# Patient Record
Sex: Male | Born: 1980 | Race: White | Hispanic: No | Marital: Married | Smoking: Never smoker
Health system: Southern US, Community
[De-identification: ages and names within clinical notes are randomized; demographics above are authoritative.]

## PROBLEM LIST (undated history)

## (undated) DIAGNOSIS — G43909 Migraine, unspecified, not intractable, without status migrainosus: Secondary | ICD-10-CM

## (undated) DIAGNOSIS — F439 Reaction to severe stress, unspecified: Secondary | ICD-10-CM

## (undated) HISTORY — DX: Migraine, unspecified, not intractable, without status migrainosus: G43.909

## (undated) HISTORY — PX: GALLBLADDER SURGERY: SHX652

## (undated) HISTORY — PX: APPENDECTOMY: SHX54

---

## 2014-08-06 ENCOUNTER — Ambulatory Visit: Payer: Self-pay | Admitting: Family Medicine

## 2014-08-12 ENCOUNTER — Encounter: Payer: Self-pay | Admitting: Family Medicine

## 2014-08-12 ENCOUNTER — Ambulatory Visit (INDEPENDENT_AMBULATORY_CARE_PROVIDER_SITE_OTHER): Payer: BLUE CROSS/BLUE SHIELD | Admitting: Family Medicine

## 2014-08-12 VITALS — BP 143/95 | HR 82 | Ht 71.0 in | Wt 200.0 lb

## 2014-08-12 DIAGNOSIS — G43809 Other migraine, not intractable, without status migrainosus: Secondary | ICD-10-CM

## 2014-08-12 DIAGNOSIS — H6123 Impacted cerumen, bilateral: Secondary | ICD-10-CM | POA: Diagnosis not present

## 2014-08-12 MED ORDER — RELPAX 40 MG PO TABS: 40.0000 mg | ORAL_TABLET | Freq: Every day | ORAL | Status: AC | PRN

## 2014-08-12 MED ORDER — TOPIRAMATE 25 MG PO TABS: 75.0000 mg | ORAL_TABLET | Freq: Every day | ORAL | Status: DC

## 2014-08-12 NOTE — Progress Notes (Signed)
CC: Juan BurgerJason Inlow is a 34 y.o. male is here for Establish Care   Subjective: HPI:  Very pleasant 34 year old here to establish care  Complains of a history of migraines at present for at least the last year. He [facility] up until late 2015 when he relocated to here. He's tried nortriptyline in the past however this made his headaches worse. He had been taking 75 mg of Topamax on a nightly basis up until a few months ago when he ran out of the medication. Since then he's had worsening of the frequency and severity of his migraines. They're usually localized in the right temple, described as pressure, worse around loud noises. It was once accompanied by nausea but never more. He gets a moderate response from Relpax. He's tried another number of triptans and all of which made him feel worse.  Not awakening from headache.  Complaint of bilateral ear fullness and decreased hearing in the left ear if he's been lying on his left side for a few hours. It'll improve if he manipulates his ear he's had difficulty with keeping cerumen to a minimum no interventions as of yet. He is required irrigation in the past.  Review of Systems - General ROS: negative for - chills, fever, night sweats, weight gain or weight loss Ophthalmic ROS: negative for - decreased vision Psychological ROS: negative for - anxiety or depression ENT ROS: negative for - , nasal congestion, tinnitus or allergies Hematological and Lymphatic ROS: negative for - bleeding problems, bruising or swollen lymph nodes Breast ROS: negative Respiratory ROS: no cough, shortness of breath, or wheezing Cardiovascular ROS: no chest pain or dyspnea on exertion Gastrointestinal ROS: no abdominal pain, change in bowel habits, or black or bloody stools Genito-Urinary ROS: negative for - genital discharge, genital ulcers, incontinence or abnormal bleeding from genitals Musculoskeletal ROS: negative for - joint pain or muscle  pain Neurological ROS: negative for -memory loss Dermatological ROS: negative for lumps, mole changes, rash and skin lesion changes  Past Medical History  Diagnosis Date  . Migraine     Past Surgical History  Procedure Laterality Date  . Appendectomy    . Gallbladder surgery     Family History  Problem Relation Age of Onset  . Breast cancer      grandmother   . Heart attack      grandparent  . Diabetes      parent    History   Social History  . Marital Status: Married    Spouse Name: N/A  . Number of Children: N/A  . Years of Education: N/A   Occupational History  . Not on file.   Social History Main Topics  . Smoking status: Never Smoker   . Smokeless tobacco: Not on file  . Alcohol Use: 0.6 oz/week    1 Standard drinks or equivalent per week  . Drug Use: No  . Sexual Activity:    Partners: Female   Other Topics Concern  . Not on file   Social History Narrative  . No narrative on file     Objective: BP 143/95 mmHg  Pulse 82  Ht 5\' 11"  (1.803 m)  Wt 200 lb (90.719 kg)  BMI 27.91 kg/m2  General: Alert and Oriented, No Acute Distress HEENT: Pupils equal, round, reactive to light. Conjunctivae clear. On initial exam I lateral cerumen impaction. Following successful removal External ears unremarkable, canals clear with intact TMs with appropriate landmarks.  Middle ear appears open without  effusion. Pink inferior turbinates.  Moist mucous membranes, pharynx without inflammation nor lesions.  Neck supple without palpable lymphadenopathy nor abnormal masses. Lungs: Clear to auscultation bilaterally, no wheezing/ronchi/rales.  Comfortable work of breathing. Good air movement. Cardiac: Regular rate and rhythm. Normal S1/S2.  No murmurs, rubs, nor gallops.   Neuro: Cranial nerves II-12 grossly intact Extremities: No peripheral edema.  Strong peripheral pulses.  Mental Status: No depression, anxiety, nor agitation. Skin: Warm and dry.  Assessment & Plan: Juan Frederick  was seen today for establish care.  Diagnoses and all orders for this visit:  Other migraine without status migrainosus, not intractable Orders: -     topiramate (TOPAMAX) 25 MG tablet; Take 3 tablets (75 mg total) by mouth at bedtime. -     RELPAX 40 MG tablet; Take 1 tablet (40 mg total) by mouth daily as needed for migraine or headache.  Bilateral impacted cerumen   Cerumen impaction: Successful removal with irrigation, hearing improved. Discussed hydrogen peroxide application on a weekly basis to help reduce wax buildup. Migraines: Uncontrolled off of Topamax restarting former regimen of Topamax and discussed that we can always increase this if needed since he is on a preload dose. Relpax as needed. Return in one monthif migraines are still interfering with quality of life  Indication: Cerumen impaction of the ears Medical necessity statement: On physical examination, cerumen impairs clinically significant portions of the external auditory canal, and tympanic membrane. Noted obstructive, copious cerumen that cannot be removed without magnification and instrumentations requiring physician skills Consent: Discussed benefits and risks of procedure and verbal consent obtained Procedure: Patient was prepped for the procedure. Utilized an otoscope to assess and take note of the ear canal, the tympanic membrane, and the presence, amount, and placement of the cerumen. Gentle water irrigation and soft plastic curette was utilized to remove cerumen.  Post procedure examination: shows cerumen was completely removed. Patient tolerated procedure well. The patient is made aware that they may experience temporary vertigo, temporary hearing loss, and temporary discomfort. If these symptom last for more than 24 hours to call the clinic or proceed to the ED.      Return if symptoms worsen or fail to improve.

## 2014-12-31 ENCOUNTER — Emergency Department (INDEPENDENT_AMBULATORY_CARE_PROVIDER_SITE_OTHER)
Admission: EM | Admit: 2014-12-31 | Discharge: 2014-12-31 | Disposition: A | Discharge status: Home / Self Care | Payer: BLUE CROSS/BLUE SHIELD | Source: Home / Self Care | Attending: Family Medicine | Admitting: Family Medicine

## 2014-12-31 ENCOUNTER — Encounter: Payer: Self-pay | Admitting: Emergency Medicine

## 2014-12-31 DIAGNOSIS — R519 Headache, unspecified: Secondary | ICD-10-CM

## 2014-12-31 DIAGNOSIS — J0100 Acute maxillary sinusitis, unspecified: Secondary | ICD-10-CM | POA: Diagnosis not present

## 2014-12-31 DIAGNOSIS — R51 Headache: Secondary | ICD-10-CM

## 2014-12-31 MED ORDER — SALINE SPRAY 0.65 % NA SOLN: 1.0000 | NASAL | Status: AC | PRN

## 2014-12-31 MED ORDER — BENZONATATE 100 MG PO CAPS: 100.0000 mg | ORAL_CAPSULE | Freq: Three times a day (TID) | ORAL | Status: AC

## 2014-12-31 MED ORDER — DOXYCYCLINE HYCLATE 100 MG PO CAPS: 100.0000 mg | ORAL_CAPSULE | Freq: Two times a day (BID) | ORAL | Status: AC

## 2014-12-31 MED ORDER — DM-GUAIFENESIN ER 30-600 MG PO TB12: 1.0000 | ORAL_TABLET | Freq: Two times a day (BID) | ORAL | Status: AC

## 2014-12-31 NOTE — Discharge Instructions (Signed)
You may take 400-600mg  Ibuprofen (Motrin) every 6-8 hours for fever and pain  Alternate with Tylenol  You may take  Tylenol every 4-6 hours as needed for fever and pain  Follow-up with your primary care provider next week for recheck of symptoms if not improving.  Be sure to drink plenty of fluids and rest, at least 8hrs of sleep a night, preferably more while you are sick. Please go to closest ER if you cannot keep down fluids/signs of dehydration, fever not reducing with Tylenol, difficulty breathing/wheezing, stiff neck, worsening condition, or other concerns (see below)

## 2014-12-31 NOTE — ED Notes (Signed)
Sore throat, headache, congestion, body aches, fatigue x 6 days

## 2014-12-31 NOTE — ED Provider Notes (Signed)
CSN: [id number]     Arrival date & time 12/31/14  [id number] History   First MD Initiated Contact with Patient 12/31/14 0813     Chief Complaint  Patient presents with  . URI   (Consider location/radiation/quality/duration/timing/severity/associated sxs/prior Treatment) HPI  Patient is a 34 year old male presenting to urgent care with complaint of gradually worsening URI symptoms including sore throat, frontal headache, nasal congestion and body aches for the last 6 days.  Patient also complains of fatigue and fever with Tmax: 102 F.  patient states his frontal headache is moderate to severe aching and sore has been persistent.  Headache was gradual in onset.  Feel similar to previous headaches.  Patient states he has been taking DayQuil with minimal relief.  Denies sick contacts or recent travel.  Denies nausea, vomiting or diarrhea.  Denies neck pain or stiffness.  Past Medical History  Diagnosis Date  . Migraine    Past Surgical History  Procedure Laterality Date  . Appendectomy    . Gallbladder surgery     Family History  Problem Relation Age of Onset  . Breast cancer      grandmother   . Heart attack      grandparent  . Diabetes      parent   History  Substance Use Topics  . Smoking status: Never Smoker   . Smokeless tobacco: Not on file  . Alcohol Use: 0.6 oz/week    1 Standard drinks or equivalent per week    Review of Systems  Constitutional: Positive for fever ( Tmax 102), chills and fatigue.  HENT: Positive for congestion, postnasal drip, rhinorrhea, sinus pressure, sore throat and voice change. Negative for ear pain and trouble swallowing.   Respiratory: Positive for cough. Negative for shortness of breath.   Cardiovascular: Negative for chest pain and palpitations.  Gastrointestinal: Negative for nausea, vomiting, abdominal pain and diarrhea.  Genitourinary: Negative for dysuria, urgency, hematuria and flank pain.  Musculoskeletal: Positive for myalgias and  arthralgias. Negative for neck pain and neck stiffness.  Neurological: Positive for headaches. Negative for dizziness and light-headedness.    Allergies  Nortriptyline  Home Medications   Prior to Admission medications   Medication Sig Start Date End Date Taking? Authorizing Provider  Pseudoephedrine-APAP-DM (DAYQUIL MULTI-SYMPTOM COLD/FLU PO) Take by mouth.   Yes Historical Provider, MD  benzonatate (TESSALON) 100 MG capsule Take 1 capsule (100 mg total) by mouth every 8 (eight) hours. 12/31/14   Junius FinnerErin O'Malley, PA-C  dextromethorphan-guaiFENesin (MUCINEX DM) 30-600 MG per 12 hr tablet Take 1 tablet by mouth 2 (two) times daily. 12/31/14   Junius FinnerErin O'Malley, PA-C  doxycycline (VIBRAMYCIN) 100 MG capsule Take 1 capsule (100 mg total) by mouth 2 (two) times daily. One po bid x 7 days 12/31/14   Junius FinnerErin O'Malley, PA-C  RELPAX 40 MG tablet Take 1 tablet (40 mg total) by mouth daily as needed for migraine or headache. 08/12/14   Laren BoomSean Hommel, DO  sodium chloride (OCEAN) 0.65 % SOLN nasal spray Place 1 spray into both nostrils as needed for congestion. 12/31/14   Junius FinnerErin O'Malley, PA-C  topiramate (TOPAMAX) 25 MG tablet Take 3 tablets (75 mg total) by mouth at bedtime. 08/12/14   Sean Hommel, DO   BP 120/86 mmHg  Pulse 91  Temp(Src) 98.7 F (37.1 C) (Oral)  Ht 5\' 11"  (1.803 m)  Wt 188 lb (85.276 kg)  BMI 26.23 kg/m2  SpO2 99% Physical Exam  Constitutional: He appears well-developed and well-nourished.  Pt lying on exam bed,  appears fatigued, acutely ill but not in distress  HENT:  Head: Normocephalic and atraumatic.  Right Ear: Hearing, tympanic membrane, external ear and ear canal normal.  Left Ear: Hearing, tympanic membrane, external ear and ear canal normal.  Nose: Mucosal edema present. Right sinus exhibits maxillary sinus tenderness. Right sinus exhibits no frontal sinus tenderness. Left sinus exhibits no maxillary sinus tenderness and no frontal sinus tenderness.  Mouth/Throat: Uvula is midline,  oropharynx is clear and moist and mucous membranes are normal.  Eyes: Conjunctivae and EOM are normal. Pupils are equal, round, and reactive to light. No scleral icterus.  Neck: Normal range of motion. Neck supple.  Cardiovascular: Normal rate, regular rhythm and normal heart sounds.   Pulmonary/Chest: Effort normal and breath sounds normal. No respiratory distress. He has no wheezes. He has no rales. He exhibits no tenderness.  Abdominal: Soft. Bowel sounds are normal. He exhibits no distension and no mass. There is no tenderness. There is no rebound and no guarding.  Musculoskeletal: Normal range of motion.  Neurological: He is alert.  Skin: Skin is warm and dry.  Nursing note and vitals reviewed.   ED Course  Procedures (including critical care time) Labs Review Labs Reviewed - No data to display  Imaging Review No results found.   MDM   1. Acute maxillary sinusitis, recurrence not specified   2. Frontal headache     Patient is a 34 year old male presenting to urgent care with sore throat, frontal headache, nasal congestion, body aches with fatigue and fever up to 102 for last 6 days.  Exam consistent with maxillary sinusitis.  Will start patient on doxycycline.  We'll also prescribe Mucinex, Tessalon Perles, and saline nasal spray for comfort.  Encourage plenty of fluids and rest.  Advised he may use acetaminophen and/or ibuprofen as needed for fever or pain.  Follow-up with PCP in one week if not improving, sooner if worsening. Return precautions provided. Pt verbalized understanding and agreement with tx plan.     Junius Finner, PA-C 12/31/14 [phone]

## 2015-01-08 ENCOUNTER — Other Ambulatory Visit: Payer: Self-pay | Admitting: Family Medicine

## 2015-02-12 ENCOUNTER — Other Ambulatory Visit: Payer: Self-pay | Admitting: Family Medicine

## 2015-03-17 ENCOUNTER — Encounter (HOSPITAL_COMMUNITY): Payer: Self-pay | Admitting: Emergency Medicine

## 2015-03-17 ENCOUNTER — Emergency Department (HOSPITAL_COMMUNITY): Payer: BLUE CROSS/BLUE SHIELD

## 2015-03-17 ENCOUNTER — Emergency Department (HOSPITAL_COMMUNITY)
Admission: EM | Admit: 2015-03-17 | Discharge: 2015-03-17 | Disposition: A | Discharge status: Home / Self Care | Payer: BLUE CROSS/BLUE SHIELD | Attending: Emergency Medicine | Admitting: Emergency Medicine

## 2015-03-17 DIAGNOSIS — Y9289 Other specified places as the place of occurrence of the external cause: Secondary | ICD-10-CM | POA: Insufficient documentation

## 2015-03-17 DIAGNOSIS — Z79899 Other long term (current) drug therapy: Secondary | ICD-10-CM | POA: Insufficient documentation

## 2015-03-17 DIAGNOSIS — Y9389 Activity, other specified: Secondary | ICD-10-CM | POA: Insufficient documentation

## 2015-03-17 DIAGNOSIS — S92912A Unspecified fracture of left toe(s), initial encounter for closed fracture: Secondary | ICD-10-CM

## 2015-03-17 DIAGNOSIS — G43909 Migraine, unspecified, not intractable, without status migrainosus: Secondary | ICD-10-CM | POA: Insufficient documentation

## 2015-03-17 DIAGNOSIS — W208XXA Other cause of strike by thrown, projected or falling object, initial encounter: Secondary | ICD-10-CM | POA: Insufficient documentation

## 2015-03-17 DIAGNOSIS — Y998 Other external cause status: Secondary | ICD-10-CM | POA: Insufficient documentation

## 2015-03-17 DIAGNOSIS — S92422A Displaced fracture of distal phalanx of left great toe, initial encounter for closed fracture: Secondary | ICD-10-CM | POA: Insufficient documentation

## 2015-03-17 MED ORDER — IBUPROFEN 800 MG PO TABS
800.0000 mg | ORAL_TABLET | Freq: Three times a day (TID) | ORAL | Status: AC | PRN
Start: 2015-03-17 — End: ?

## 2015-03-17 MED ORDER — TETANUS-DIPHTH-ACELL PERTUSSIS 5-2.5-18.5 LF-MCG/0.5 IM SUSP
0.5000 mL | Freq: Once | INTRAMUSCULAR | Status: AC
  Administered 2015-03-17: 0.5 mL via INTRAMUSCULAR
  Filled 2015-03-17: qty 0.5

## 2015-03-17 NOTE — ED Notes (Signed)
Declined W/C at D/C and was escorted to lobby by RN. 

## 2015-03-17 NOTE — ED Provider Notes (Signed)
CSN: [id number]     Arrival date & time 03/17/15  4098 History  This chart was scribed for non-physician practitioner, Trixie Dredge, PA-C, working with Elwin Mocha, MD by Marica Otter, ED Scribe. This patient was seen in room TR09C/TR09C and the patient's care was started at 9:45 AM.   Chief Complaint  Patient presents with  . Foot Injury   The history is provided by the patient. No language interpreter was used.   PCP: Tandy Gaw, PA-C HPI Comments: Juan Frederick is a 34 y.o. male who presents to the Emergency Department complaining of traumatic, sudden onset, improving, numbing pain to left foot onset this morning at 8am after a very heavy box fell on pt's foot. Pt reports he has not put any weight on his left foot since the accident. Pt's last tetanus vaccine is unknown. Denies weakness.  Denies other injury.     Past Medical History  Diagnosis Date  . Migraine    Past Surgical History  Procedure Laterality Date  . Appendectomy    . Gallbladder surgery     Family History  Problem Relation Age of Onset  . Breast cancer      grandmother   . Heart attack      grandparent  . Diabetes      parent   Social History  Substance Use Topics  . Smoking status: Never Smoker   . Smokeless tobacco: None  . Alcohol Use: 0.6 oz/week    1 Standard drinks or equivalent per week    Review of Systems  Constitutional: Negative for fever.  Musculoskeletal: Positive for arthralgias (left foot pain; left greater toe pain).  Skin: Positive for wound.  Allergic/Immunologic: Negative for immunocompromised state.  Neurological: Negative for weakness and numbness.  Hematological: Does not bruise/bleed easily.  Psychiatric/Behavioral: Negative for self-injury.   Allergies  Nortriptyline  Home Medications   Prior to Admission medications   Medication Sig Start Date End Date Taking? Authorizing Provider  benzonatate (TESSALON) 100 MG capsule Take 1 capsule (100 mg total) by mouth every 8  (eight) hours. 12/31/14   Junius Finner, PA-C  dextromethorphan-guaiFENesin (MUCINEX DM) 30-600 MG per 12 hr tablet Take 1 tablet by mouth 2 (two) times daily. 12/31/14   Junius Finner, PA-C  doxycycline (VIBRAMYCIN) 100 MG capsule Take 1 capsule (100 mg total) by mouth 2 (two) times daily. One po bid x 7 days 12/31/14   Junius Finner, PA-C  Pseudoephedrine-APAP-DM (DAYQUIL MULTI-SYMPTOM COLD/FLU PO) Take by mouth.    Historical Provider, MD  RELPAX 40 MG tablet Take 1 tablet (40 mg total) by mouth daily as needed for migraine or headache. 08/12/14   Laren Boom, DO  sodium chloride (OCEAN) 0.65 % SOLN nasal spray Place 1 spray into both nostrils as needed for congestion. 12/31/14   Junius Finner, PA-C  topiramate (TOPAMAX) 25 MG tablet TAKE 3 TABLETS BY MOUTH AT BEDTIME 02/14/15   Laren Boom, DO   Triage Vitals: BP 134/94 mmHg  Pulse 70  Temp(Src) 98.3 F (36.8 C) (Oral)  Resp 18  Ht  (1.803 m)  Wt 190 lb (86.183 kg)  BMI 26.51 kg/m2  SpO2 100% Physical Exam  Constitutional: He appears well-developed and well-nourished. No distress.  HENT:  Head: Normocephalic and atraumatic.  Neck: Neck supple.  Pulmonary/Chest: Effort normal.  Musculoskeletal: He exhibits tenderness. He exhibits no edema.  Left great toe with small abrasion over dorsal aspect of first MTP mild tenderness throughout first toe.   Neurological: He is alert.  Skin: He is not diaphoretic. No pallor.  Psychiatric: He has a normal mood and affect. His behavior is normal.  Nursing note and vitals reviewed.  ED Course  Procedures (including critical care time) DIAGNOSTIC STUDIES: Oxygen Saturation is 100% on ra, nl by my interpretation.    COORDINATION OF CARE: 9:47 AM: Discussed treatment plan which includes tetanus shot, post op shoe, with pt at bedside; patient verbalizes understanding and agrees with treatment plan. Pt offered crutches but he declined.   Imaging Review Dg Foot Complete Left  03/17/2015    CLINICAL DATA:  Steel box fell on left foot this morning at work with pain and abrasion to first toe.  EXAM: LEFT FOOT - COMPLETE 3+ VIEW  COMPARISON:  None.  FINDINGS: Examination demonstrates a minimally displaced transverse fracture along the base of the first distal phalanx. This does not appear to extend to the articular surface. Remainder of the exam is within normal.  IMPRESSION: Minimally displaced transverse fracture through the base of the first distal phalanx.   Electronically Signed   By: Elberta Fortis M.D.   On: 03/17/2015 09:37   I have personally reviewed and evaluated these images as part of my medical decision-making.  MDM   Final diagnoses:  Toe fracture, left, closed, initial encounter    Afebrile, nontoxic patient with injury to left great toe at work.  Fracture sustained.  Neurovascularly intact, mild abrasion.  Tdap UTD.  D/C home with buddy tape, postop shoe, ibuprofen.  Pt declines crutches.  Discussed result, findings, treatment, and follow up  with patient.  Pt given return precautions.  Pt verbalizes understanding and agrees with plan.        I personally performed the services described in this documentation, which was scribed in my presence. The recorded information has been reviewed and is accurate.    Trixie Dredge, PA-C 03/17/15 1455  Elwin Mocha, MD 03/17/15 1524

## 2015-03-17 NOTE — ED Notes (Signed)
Pt c/o steel box fell onto left foot just about 8 am today while at work. Pt ambulatory with a limp.

## 2015-03-17 NOTE — Discharge Instructions (Signed)
Read the information below.  Use the prescribed medication as directed.  Please discuss all new medications with your pharmacist.  You may return to the Emergency Department at any time for worsening condition or any new symptoms that concern you.  If you develop uncontrolled pain, weakness or numbness of the extremity, severe discoloration of the skin, or you are unable to move or feel your toes, return to the ER for a recheck.     Buddy Taping of Toes We have taped your toes together to keep them from moving. This is called "buddy taping" since we used a part of your own body to keep the injured part still. We placed soft padding between your toes to keep them from rubbing against each other. Buddy taping will help with healing and to reduce pain. Keep your toes buddy taped together for as long as directed by your caregiver. HOME CARE INSTRUCTIONS   Raise your injured area above the level of your heart while sitting or lying down. Prop it up with pillows.  An ice pack used every twenty minutes, while awake, for the first one to two days may be helpful. Put ice in a plastic bag and put a towel between the bag and your skin.  Watch for signs that the taping is too tight. These signs may be:  Numbness of your taped toes.  Coolness of your taped toes.  Color change in the area beyond the tape.  Increased pain.  If you have any of these signs, loosen or rewrap the tape. If you need to loosen or rewrap the buddy tape, make sure you use the padding again. SEEK IMMEDIATE MEDICAL CARE IF:   You have worse pain, swelling, inflammation (soreness), drainage or bleeding after you rewrap the tape.  Any new problems occur. MAKE SURE YOU:   Understand these instructions.  Will watch your condition.  Will get help right away if you are not doing well or get worse. Document Released: 03/11/2004 Document Revised: 08/30/2011 Document Reviewed: 06/04/2008 [facility] Patient Information 2015 [place],  [place]. This information is not intended to replace advice given to you by your health care provider. Make sure you discuss any questions you have with your health care provider.  Toe Fracture Your caregiver has diagnosed you as having a fractured toe. A toe fracture is a break in the bone of a toe. "Buddy taping" is a way of splinting your broken toe, by taping the broken toe to the toe next to it. This "buddy taping" will keep the injured toe from moving beyond normal range of motion. Buddy taping also helps the toe heal in a more normal alignment. It may take 6 to 8 weeks for the toe injury to heal. HOME CARE INSTRUCTIONS   Leave your toes taped together for as long as directed by your caregiver or until you see a doctor for a follow-up examination. You can change the tape after bathing. Always use a small piece of gauze or cotton between the toes when taping them together. This will help the skin stay dry and prevent infection.  Apply ice to the injury for 15-20 minutes each hour while awake for the first 2 days. Put the ice in a plastic bag and place a towel between the bag of ice and your skin.  After the first 2 days, apply heat to the injured area. Use heat for the next 2 to 3 days. Place a heating pad on the foot or soak the foot in warm  water as directed by your caregiver.  Keep your foot elevated as much as possible to lessen swelling.  Wear sturdy, supportive shoes. The shoes should not pinch the toes or fit tightly against the toes.  Your caregiver may prescribe a rigid shoe if your foot is very swollen.  Your may be given crutches if the pain is too great and it hurts too much to walk.  Only take over-the-counter or prescription medicines for pain, discomfort, or fever as directed by your caregiver.  If your caregiver has given you a follow-up appointment, it is very important to keep that appointment. Not keeping the appointment could result in a chronic or permanent injury, pain,  and disability. If there is any problem keeping the appointment, you must call back to this facility for assistance. SEEK MEDICAL CARE IF:   You have increased pain or swelling, not relieved with medications.  The pain does not get better after 1 week.  Your injured toe is cold when the others are warm. SEEK IMMEDIATE MEDICAL CARE IF:   The toe becomes cold, numb, or white.  The toe becomes hot (inflamed) and red. Document Released: 06/04/2000 Document Revised: 08/30/2011 Document Reviewed: 01/22/2008 [facility] Patient Information 2015 [place], [place]. This information is not intended to replace advice given to you by your health care provider. Make sure you discuss any questions you have with your health care provider.

## 2021-08-12 ENCOUNTER — Encounter

## 2021-08-12 ENCOUNTER — Ambulatory Visit
Admit: 2021-08-12 | Discharge: 2021-08-12 | Payer: BLUE CROSS/BLUE SHIELD | Attending: Family Medicine | Primary: Family Medicine

## 2021-08-12 DIAGNOSIS — F439 Reaction to severe stress, unspecified: Secondary | ICD-10-CM

## 2021-08-12 LAB — CBC WITH AUTO DIFFERENTIAL
Absolute Baso #: 0.1 10*3/uL (ref 0.0–0.2)
Absolute Eos #: 0.3 10*3/uL (ref 0.0–0.5)
Absolute Lymph #: 2.1 10*3/uL (ref 1.0–3.2)
Absolute Mono #: 0.5 10*3/uL (ref 0.3–1.0)
Basophils %: 0.7 % (ref 0.0–2.0)
Eosinophils %: 4.7 % (ref 0.0–7.0)
Hematocrit: 46.7 % (ref 38.0–52.0)
Hemoglobin: 16.7 g/dL (ref 13.0–17.3)
Immature Grans (Abs): 0.01 10*3/uL (ref 0.00–0.06)
Immature Granulocytes: 0.1 % (ref 0.0–0.6)
Lymphocytes: 31.3 % (ref 15.0–45.0)
MCH: 29.6 pg (ref 27.0–34.5)
MCHC: 35.8 g/dL (ref 32.0–36.0)
MCV: 82.7 fL — ABNORMAL LOW (ref 84.0–100.0)
MPV: 9.6 fL (ref 7.2–13.2)
Monocytes: 7.3 % (ref 4.0–12.0)
NRBC Absolute: 0 10*3/uL (ref 0.000–0.012)
NRBC Automated: 0 % (ref 0.0–0.2)
Neutrophils %: 55.9 % (ref 42.0–74.0)
Neutrophils Absolute: 3.8 10*3/uL (ref 1.6–7.3)
Platelets: 222 10*3/uL (ref 140–440)
RBC: 5.65 x10e6/mcL — ABNORMAL HIGH (ref 4.00–5.60)
RDW: 12.3 % (ref 11.0–16.0)
WBC: 6.8 10*3/uL (ref 3.8–10.6)

## 2021-08-12 LAB — LIPID PANEL
Chol/HDL Ratio: 4.6 — ABNORMAL HIGH (ref 0.0–4.4)
Cholesterol: 207 mg/dL — ABNORMAL HIGH (ref 100–200)
HDL: 45 mg/dL (ref >=40)
LDL Cholesterol: 144.2 mg/dL — ABNORMAL HIGH (ref 0.0–100.0)
LDL/HDL Ratio: 3.2
Triglycerides: 89 mg/dL (ref 0–149)
VLDL: 17.8 mg/dL (ref 5.0–40.0)

## 2021-08-12 LAB — COMPREHENSIVE METABOLIC PANEL
ALT: 59 U/L — ABNORMAL HIGH (ref 0–50)
AST: 35 U/L (ref 0–50)
Albumin/Globulin Ratio: 2.3 (ref 1.00–2.70)
Albumin: 4.9 g/dL (ref 3.5–5.2)
Alk Phosphatase: 77 U/L (ref 40–130)
Anion Gap: 11 mmol/L (ref 2–17)
BUN: 23 mg/dL — ABNORMAL HIGH (ref 6–20)
CO2: 27 mmol/L (ref 22–29)
Calcium: 9.7 mg/dL (ref 8.6–10.0)
Chloride: 102 mmol/L (ref 98–107)
Creatinine: 1.2 mg/dL (ref 0.7–1.3)
Est, Glom Filt Rate: 78 mL/min/1.73m (ref >=60)
Globulin: 2.1 g/dL (ref 1.9–4.4)
Glucose: 93 mg/dL (ref 70–99)
OSMOLALITY CALCULATED: 283 mOsm/kg (ref 270–287)
Potassium: 4.1 mmol/L (ref 3.5–5.3)
Sodium: 140 mmol/L (ref 135–145)
Total Bilirubin: 1.09 mg/dL (ref 0.00–1.20)
Total Protein: 7 g/dL (ref 6.4–8.3)

## 2021-08-12 LAB — TSH WITH REFLEX: TSH: 1.05 mcIU/mL (ref 0.358–3.740)

## 2021-08-12 MED ORDER — DULOXETINE HCL 60 MG PO CPEP
60 MG | ORAL_CAPSULE | Freq: Every day | ORAL | 1 refills | Status: DC
Start: 2021-08-12 — End: 2021-11-04

## 2021-08-12 MED ORDER — CYANOCOBALAMIN 1000 MCG PO LOZG
1000 | LOZENGE | Freq: Every day | ORAL | 5 refills | Status: DC
Start: 2021-08-12 — End: 2022-02-02

## 2021-08-12 NOTE — Assessment & Plan Note (Signed)
check and record BP

## 2021-08-12 NOTE — Progress Notes (Signed)
Date of Service: 08/12/2021  Patient: Alan Myers  DOB: 1981/06/13  Chief Complaint   Patient presents with    Follow-up       Patient Care Team:  Vilma Prader, MD as PCP - General  Vilma Prader, MD as PCP - Empaneled Provider    History of Present Illness:  Alan Myers a 41 y.o. male presents today for evaluation of the following issues:  Anxiety tress depression on meds which work well, has chronic fatigue , feels unmotivated , no sleep issues       Current Outpatient Medications   Medication Sig Dispense Refill    DULoxetine (CYMBALTA) 60 MG extended release capsule Take 1 capsule by mouth daily 90 capsule 1    Cyanocobalamin 1000 MCG LOZG Take 1,000 mg by mouth daily 30 lozenge 5    ibuprofen (ADVIL;MOTRIN) 800 MG tablet 1 tablet Orally every 8 hrs as needed for pain (take with food) (Patient not taking: Reported on 07/15/2021)      oseltamivir (TAMIFLU) 75 MG capsule 1 capsule (Patient not taking: Reported on 07/15/2021)       No current facility-administered medications for this visit.       No Known Allergies  Patient Active Problem List   Diagnosis    Plica syndrome of knee    Chronic fatigue    Pes anserinus tendinitis and bursitis    Stress    Elevated BP without diagnosis of hypertension     Past Medical History:   Diagnosis Date    Alcohol abuse     Asthma     Chronic fatigue     Depression     Drug abuse (HCC)     Pes anserine bursitis 10/20/2011    ROBERT SCHODERBEK    Quadriceps tendinitis 10/20/2011    ROBERT SCHODERBEK    Stress 07/13/2018     Past Surgical History:   Procedure Laterality Date    APPENDECTOMY      CHOLECYSTECTOMY       Family History   Problem Relation Age of Onset    Diabetes Mother      Social History     Tobacco Use    Smoking status: Never    Smokeless tobacco: Never   Substance Use Topics    Alcohol use: Yes     Comment: 2-4 TIMES A MONTH     Social History     Social History Narrative    Not on file         ROS:    Review of Systems   Constitutional:  Negative for activity  change, fatigue and fever.   Respiratory:  Negative for shortness of breath.    Cardiovascular:  Negative for chest pain.   Gastrointestinal:  Negative for abdominal pain and nausea.   Genitourinary:  Negative for difficulty urinating.   Neurological:  Negative for dizziness and headaches.   Hematological:  Negative for adenopathy.       PHYSICAL EXAM:    BP (!) 142/96 (Site: Left Upper Arm, Position: Sitting, Cuff Size: Large Adult)    Pulse 76    Ht 5\' 11"  (1.803 m)    Wt 212 lb 9 oz (96.4 kg)    SpO2 98%    BMI 29.65 kg/m??   Vitals:    08/12/21 1026   BP: (!) 142/96   Site: Left Upper Arm   Position: Sitting   Cuff Size: Large Adult   Pulse: 76   SpO2: 98%  Weight: 212 lb 9 oz (96.4 kg)   Height: 5\' 11"  (1.803 m)         Physical Exam  Constitutional:       General: He is not in acute distress.     Appearance: Normal appearance.   HENT:      Head: Normocephalic.   Cardiovascular:      Rate and Rhythm: Normal rate and regular rhythm.      Heart sounds: No murmur heard.  Pulmonary:      Effort: Pulmonary effort is normal. No respiratory distress.      Breath sounds: No rhonchi.   Chest:      Chest wall: No tenderness.   Abdominal:      General: There is no distension.      Palpations: Abdomen is soft. There is no mass.      Tenderness: There is no abdominal tenderness.      Hernia: No hernia is present.   Skin:     General: Skin is warm.      Findings: No rash.   Neurological:      General: No focal deficit present.      Mental Status: He is alert and oriented to person, place, and time.   Psychiatric:         Mood and Affect: Mood normal.       No results found for any visits on 08/12/21.    ASSESSMENT and PLAN:    Stress  -     DULoxetine (CYMBALTA) 60 MG extended release capsule; Take 1 capsule by mouth daily, Disp-90 capsule, R-1Normal  -     Cyanocobalamin 1000 MCG LOZG; Take 1,000 mg by mouth daily, Disp-30 lozenge, R-5Normal  -     Lipid Panel; Future  -     Comprehensive Metabolic Panel; Future  -     CBC  with Auto Differential; Future  -     TSH with Reflex; Future  Elevated BP without diagnosis of hypertension  Assessment & Plan:    check and record BP  Orders:  -     Comprehensive Metabolic Panel; Future  -     CBC with Auto Differential; Future      Return in about 4 weeks (around 09/09/2021).

## 2021-08-21 ENCOUNTER — Encounter

## 2021-09-08 ENCOUNTER — Ambulatory Visit
Admit: 2021-09-08 | Discharge: 2021-09-08 | Payer: BLUE CROSS/BLUE SHIELD | Attending: Family Medicine | Primary: Family Medicine

## 2021-09-08 DIAGNOSIS — F439 Reaction to severe stress, unspecified: Secondary | ICD-10-CM

## 2021-09-08 NOTE — Progress Notes (Signed)
Date of Service: 09/08/2021  Patient: Alan Myers  DOB: [birth year]  Chief Complaint   Patient presents with    Follow-up     Discuss painful bump on top of both feet  Discuss being prescribed Omeprazole        Patient Care Team:  Vilma Prader, MD as PCP - General  Vilma Prader, MD as PCP - Empaneled Provider    History of Present Illness:  Alan Myers a 41 y.o. male presents today for evaluation of the following issues:  Anxiety tress depression on meds which work well, has chronic fatigue , feels unmotivated , no sleep issues   09/08/21 here for  f/u tried B12 w/o any improvement , feels tired through out the day , pt snores     BP better improved     HLD mildly elevated ALT elevated     Firm tender nodule both feet ,getting worse over months, spends a lot of time on his feet     Current Outpatient Medications   Medication Sig Dispense Refill    DULoxetine (CYMBALTA) 60 MG extended release capsule Take 1 capsule by mouth daily 90 capsule 1    Cyanocobalamin 1000 MCG LOZG Take 1,000 mg by mouth daily 30 lozenge 5    ibuprofen (ADVIL;MOTRIN) 800 MG tablet 1 tablet Orally every 8 hrs as needed for pain (take with food) (Patient not taking: No sig reported)      oseltamivir (TAMIFLU) 75 MG capsule 1 capsule (Patient not taking: No sig reported)       No current facility-administered medications for this visit.       No Known Allergies  Patient Active Problem List   Diagnosis    Plica syndrome of knee    Chronic fatigue    Pes anserinus tendinitis and bursitis    Stress    Elevated BP without diagnosis of hypertension    Elevated LFTs    Mixed hyperlipidemia    Daytime somnolence     Past Medical History:   Diagnosis Date    Alcohol abuse     Asthma     Chronic fatigue     Depression     Drug abuse (HCC)     Pes anserine bursitis 10/20/2011    ROBERT SCHODERBEK    Quadriceps tendinitis 10/20/2011    ROBERT SCHODERBEK    Stress 07/13/2018     Past Surgical History:   Procedure Laterality Date    APPENDECTOMY       CHOLECYSTECTOMY       Family History   Problem Relation Age of Onset    Diabetes Mother     Breast Cancer Maternal Grandmother      Social History     Tobacco Use    Smoking status: Never    Smokeless tobacco: Never   Substance Use Topics    Alcohol use: Yes     Alcohol/week: 3.0 standard drinks     Types: 3 Drinks containing 0.5 oz of alcohol per week     Comment: 2-4 TIMES A MONTH     Social History     Social History Narrative    Not on file         ROS:    Review of Systems   Constitutional:  Negative for activity change, fatigue, fever and unexpected weight change.   HENT:  Negative for voice change.    Respiratory:  Negative for shortness of breath.    Cardiovascular:  Negative for chest  pain.   Gastrointestinal:  Negative for abdominal pain, constipation and nausea.   Genitourinary:  Negative for difficulty urinating.   Musculoskeletal:  Negative for myalgias.   Skin:  Negative for rash.   Neurological:  Negative for dizziness, weakness and headaches.   Hematological:  Negative for adenopathy.       PHYSICAL EXAM:    BP 122/70    Pulse 67    Resp 18    Ht 5\' 11"  (1.803 m)    Wt 215 lb 11.2 oz (97.8 kg)    SpO2 98%    BMI 30.08 kg/m??   Vitals:    09/08/21 1117   BP: 122/70   Pulse: 67   Resp: 18   SpO2: 98%   Weight: 215 lb 11.2 oz (97.8 kg)   Height: 5\' 11"  (1.803 m)         Physical Exam  Constitutional:       General: He is not in acute distress.     Appearance: Normal appearance.   HENT:      Head: Normocephalic.   Cardiovascular:      Rate and Rhythm: Normal rate and regular rhythm.      Heart sounds: No murmur heard.  Pulmonary:      Effort: Pulmonary effort is normal. No respiratory distress.      Breath sounds: No rhonchi.   Chest:      Chest wall: No tenderness.   Abdominal:      General: There is no distension.      Palpations: Abdomen is soft. There is no mass.      Tenderness: There is no abdominal tenderness.      Hernia: No hernia is present.   Skin:     General: Skin is warm.      Findings: No  rash.   Neurological:      General: No focal deficit present.      Mental Status: He is alert and oriented to person, place, and time.   Psychiatric:         Mood and Affect: Mood normal.       No results found for any visits on 09/08/21.    ASSESSMENT and PLAN:    Stress  Daytime somnolence  Assessment & Plan:   sleep study may need CPAP or rx    Orders:  -     External Referral To Sleep Medicine  Elevated LFTs  Mixed hyperlipidemia  Assessment & Plan:   low fat diet may have NASH  Foot pain, bilateral  -     [facility] - , DPM, Podiatry, 09/10/21    Return in about 2 months (around 11/08/2021).

## 2021-09-08 NOTE — Assessment & Plan Note (Signed)
sleep study may need CPAP or rx

## 2021-09-08 NOTE — Assessment & Plan Note (Signed)
low fat diet may have NASH

## 2021-09-22 ENCOUNTER — Ambulatory Visit: Admit: 2021-09-22 | Discharge: 2021-09-22 | Payer: BLUE CROSS/BLUE SHIELD | Primary: Family Medicine

## 2021-09-22 ENCOUNTER — Ambulatory Visit
Admit: 2021-09-22 | Discharge: 2021-09-22 | Payer: BLUE CROSS/BLUE SHIELD | Attending: Foot Surgery | Primary: Family Medicine

## 2021-09-22 DIAGNOSIS — M79672 Pain in left foot: Secondary | ICD-10-CM

## 2021-09-22 NOTE — Progress Notes (Signed)
09/22/21   Alan Myers  01/07/1981  41 y.o.    SUBJECTIVE     Chief Complaint   Patient presents with    Pain     Patient is complaining of bilateral foot pain        HPI   Patient is concerned about a bump on the dorsal aspect of both feet left greater than right of years duration.  He states that this is gradually worsened.  It is sometimes tender in certain shoe gear.  He denies trauma to the area.  He states that it is sometimes inconvenient and uncomfortable.  He states he wears loose fitting shoes.    ROS   Review of Systems   Constitutional: Negative.    HENT: Negative.     Respiratory: Negative.     Cardiovascular: Negative.    Gastrointestinal: Negative.       MEDICAL HISTORY   Past Medical History:   Diagnosis Date    Alcohol abuse     Asthma     Chronic fatigue     Depression     Drug abuse (HCC)     Pes anserine bursitis 10/20/2011    ROBERT SCHODERBEK    Quadriceps tendinitis 10/20/2011    ROBERT SCHODERBEK    Stress 07/13/2018      Social History     Socioeconomic History    Marital status: Married     Spouse name: Not on file    Number of children: Not on file    Years of education: Not on file    Highest education level: Not on file   Occupational History    Not on file   Tobacco Use    Smoking status: Never    Smokeless tobacco: Never   Vaping Use    Vaping Use: Never used   Substance and Sexual Activity    Alcohol use: Yes     Alcohol/week: 3.0 standard drinks     Types: 3 Drinks containing 0.5 oz of alcohol per week     Comment: 2-4 TIMES A MONTH    Drug use: Yes     Types: Prescription    Sexual activity: Yes     Partners: Female   Other Topics Concern    Not on file   Social History Narrative    Not on file     Social Determinants of Health     Financial Resource Strain: Not on file   Food Insecurity: Not on file   Transportation Needs: Not on file   Physical Activity: Not on file   Stress: Not on file   Social Connections: Not on file   Intimate Partner Violence: Not on file   Housing  Stability: Not on file      Family History   Problem Relation Age of Onset    Diabetes Mother     Breast Cancer Maternal Grandmother         Outpatient Encounter Medications as of 09/22/2021   Medication Sig Dispense Refill    DULoxetine (CYMBALTA) 60 MG extended release capsule Take 1 capsule by mouth daily 90 capsule 1    Cyanocobalamin 1000 MCG LOZG Take 1,000 mg by mouth daily 30 lozenge 5    ibuprofen (ADVIL;MOTRIN) 800 MG tablet 1 tablet Orally every 8 hrs as needed for pain (take with food)      oseltamivir (TAMIFLU) 75 MG capsule 1 capsule (Patient not taking: Reported on 07/15/2021)       No facility-administered  encounter medications on file as of 09/22/2021.      No Known Allergies   Ht 5\' 11"  (1.803 m)   Wt 215 lb (97.5 kg)   BMI 29.99 kg/m     OBJECTIVE      EXAMINATION    OBJECTIVE  VASCULAR: DP and PT pulses are palpable 2/4 b/l. CFT is brisk to the digits of the foot b/l. Skin temperature is warm to cool from proximal to distal with no focal calor noted. No edema noted.     NEUROLOGIC: Gross and epicritic sensation is intact b/l. Protective sensation is appreciated at all pedal sites b/l.    DERMATOLOGIC: Skin is of normal texture and turgor of both feet.  Erythema is noted over the dorsal aspect of the first metatarsal cuneiform joint bilaterally with no evidence of ulceration.    MUSCULOSKELETAL: Muscle strength is 5/5 for all pedal groups tested. No pain with palpation of the foot or ankle b/l. Ankle joint and Subtalar joint ROM is within normal limits. No obvious biomechanical abnormalities.   An exostosis is palpated at the dorsal aspect of the first metatarsal cuneiform joint bilaterally, left greater than right.    COGNITIVE  PATIENT IS ALERT AND ORIENTED X3      ASSESSMENT:     Visit Diagnoses         Codes    Left foot pain    -  Primary M79.672    Right foot pain     M79.671    Exostosis of both feet     M89.8X7                PLAN:   I ordered and reviewed x-rays with the patient.    I  discussed the exostosis with the patient.  I discussed conservative versus surgical treatment.  I recommend continued conservative treatment with shoe modification.    If this worsens, I discussed surgical correction.  If surgical correction would be an exostectomy of the first metatarsal cuneiform joint dorsally.  I discussed the procedure and postoperative course.  I answered his questions.  He will follow-up as needed.      FOLLOW UP    Return if symptoms worsen or fail to improve, for Exostosis dorsal first metatarsal cuneiform joint bilaterally.    , DPM

## 2021-11-04 ENCOUNTER — Ambulatory Visit
Admit: 2021-11-04 | Discharge: 2021-11-04 | Payer: BLUE CROSS/BLUE SHIELD | Attending: Family Medicine | Primary: Family Medicine

## 2021-11-04 DIAGNOSIS — R400 Somnolence: Secondary | ICD-10-CM

## 2021-11-04 MED ORDER — DULOXETINE HCL 60 MG PO CPEP
60 MG | ORAL_CAPSULE | Freq: Every day | ORAL | 1 refills | Status: AC
Start: 2021-11-04 — End: 2022-02-02

## 2021-11-04 NOTE — Assessment & Plan Note (Signed)
to have sleep evaluation , pt would rather not take stimulants at this time

## 2021-11-04 NOTE — Progress Notes (Signed)
Date of Service: 11/04/2021  Patient: Alan Myers  DOB: [birth year]  Chief Complaint   Patient presents with    Follow-up       Patient Care Team:  Vilma Prader, MD as PCP - General  Vilma Prader, MD as PCP - Empaneled Provider    History of Present Illness:  FADEL CLASON a 41 y.o. male presents today for evaluation of the following issues:  Anxiety tress depression on meds which work well, has chronic fatigue , feels unmotivated , no sleep issues   09/08/21 here for  f/u tried B12 w/o any improvement , feels tired through out the day , pt snores   11/04/21 stress depression trouble getting motivated ,is set for sleep study consultation,     BP better improved     HLD mildly elevated ALT elevated     Firm tender nodule both feet ,getting worse over months, spends a lot of time on his feet   11/04/21 seen by podiatrist has bone spurs     Current Outpatient Medications   Medication Sig Dispense Refill    DULoxetine (CYMBALTA) 60 MG extended release capsule Take 1 capsule by mouth daily 90 capsule 1    Cyanocobalamin 1000 MCG LOZG Take 1,000 mg by mouth daily 30 lozenge 5    ibuprofen (ADVIL;MOTRIN) 800 MG tablet 1 tablet Orally every 8 hrs as needed for pain (take with food)      oseltamivir (TAMIFLU) 75 MG capsule 1 capsule (Patient not taking: Reported on 07/15/2021)       No current facility-administered medications for this visit.       No Known Allergies  Patient Active Problem List   Diagnosis    Plica syndrome of knee    Chronic fatigue    Pes anserinus tendinitis and bursitis    Stress    Elevated BP without diagnosis of hypertension    Elevated LFTs    Mixed hyperlipidemia    Daytime somnolence     Past Medical History:   Diagnosis Date    Alcohol abuse     Asthma     Chronic fatigue     Depression     Drug abuse (HCC)     Pes anserine bursitis 10/20/2011    ROBERT SCHODERBEK    Quadriceps tendinitis 10/20/2011    ROBERT SCHODERBEK    Stress 07/13/2018     Past Surgical History:   Procedure Laterality Date     APPENDECTOMY      CHOLECYSTECTOMY       Family History   Problem Relation Age of Onset    Diabetes Mother     Breast Cancer Maternal Grandmother      Social History     Tobacco Use    Smoking status: Never    Smokeless tobacco: Never   Substance Use Topics    Alcohol use: Yes     Alcohol/week: 3.0 standard drinks     Types: 3 Drinks containing 0.5 oz of alcohol per week     Comment: 2-4 TIMES A MONTH     Social History     Social History Narrative    Not on file         ROS:    Review of Systems   Constitutional:  Negative for activity change, fatigue and fever.   Respiratory:  Negative for shortness of breath.    Cardiovascular:  Negative for chest pain.   Gastrointestinal:  Negative for abdominal pain and nausea.  Genitourinary:  Negative for difficulty urinating.   Neurological:  Negative for dizziness and headaches.   Hematological:  Negative for adenopathy.       PHYSICAL EXAM:    BP 134/78   Pulse 89   Ht 5\' 11"  (1.803 m)   Wt 216 lb 12.8 oz (98.3 kg)   SpO2 98%   BMI 30.24 kg/m   Vitals:    11/04/21 1132   BP: 134/78   Pulse: 89   SpO2: 98%   Weight: 216 lb 12.8 oz (98.3 kg)   Height: 5\' 11"  (1.803 m)         Physical Exam  Constitutional:       General: He is not in acute distress.     Appearance: Normal appearance.   HENT:      Head: Normocephalic.   Cardiovascular:      Rate and Rhythm: Normal rate and regular rhythm.      Heart sounds: No murmur heard.  Pulmonary:      Effort: Pulmonary effort is normal. No respiratory distress.      Breath sounds: No rhonchi.   Chest:      Chest wall: No tenderness.   Abdominal:      General: There is no distension.      Palpations: Abdomen is soft. There is no mass.      Tenderness: There is no abdominal tenderness.      Hernia: No hernia is present.   Skin:     General: Skin is warm.      Findings: No rash.   Neurological:      General: No focal deficit present.      Mental Status: He is alert and oriented to person, place, and time.   Psychiatric:         Mood  and Affect: Mood normal.       No results found for any visits on 11/04/21.    ASSESSMENT and PLAN:    Daytime somnolence  Stress  -     DULoxetine (CYMBALTA) 60 MG extended release capsule; Take 1 capsule by mouth daily, Disp-90 capsule, R-1Normal  Elevated LFTs  -     Lipid Panel; Future  -     Comprehensive Metabolic Panel; Future  -     CBC with Auto Differential; Future  Mixed hyperlipidemia  -     Lipid Panel; Future  -     Comprehensive Metabolic Panel; Future  -     CBC with Auto Differential; Future      Return in about 3 months (around 02/04/2022).

## 2022-02-02 ENCOUNTER — Ambulatory Visit
Admit: 2022-02-02 | Discharge: 2022-02-02 | Payer: BLUE CROSS/BLUE SHIELD | Attending: Family Medicine | Primary: Family Medicine

## 2022-02-02 DIAGNOSIS — E782 Mixed hyperlipidemia: Secondary | ICD-10-CM

## 2022-02-02 MED ORDER — DULOXETINE HCL 30 MG PO CPEP
30 MG | ORAL_CAPSULE | Freq: Every day | ORAL | 3 refills | Status: AC
Start: 2022-02-02 — End: 2022-03-02

## 2022-02-02 MED ORDER — DULOXETINE HCL 60 MG PO CPEP
60 MG | ORAL_CAPSULE | Freq: Every day | ORAL | 1 refills | Status: AC
Start: 2022-02-02 — End: 2022-08-01

## 2022-02-02 NOTE — Progress Notes (Signed)
Date of Service: 02/02/2022  Patient: Alan Myers  DOB: [birth year]  Chief Complaint   Patient presents with    Follow-up       Patient Care Team:  Vilma Prader, MD as PCP - General  Vilma Prader, MD as PCP - Empaneled Provider    History of Present Illness:  Alan Myers a 41 y.o. male presents today for evaluation of the following issues:  Anxiety tress depression on meds which work well, has chronic fatigue , feels unmotivated , no sleep issues   09/08/21 here for  f/u tried B12 w/o any improvement , feels tired through out the day , pt snores   11/04/21 stress depression trouble getting motivated ,is set for sleep study consultation,   02/02/22 stress depression feels more moody medication not helping as much as it did     BP better improved     HLD mildly elevated ALT elevated       Current Outpatient Medications   Medication Sig Dispense Refill    DULoxetine (CYMBALTA) 60 MG extended release capsule Take 1 capsule by mouth daily 90 capsule 1    DULoxetine (CYMBALTA) 30 MG extended release capsule Take 1 capsule by mouth daily 30 capsule 3    ibuprofen (ADVIL;MOTRIN) 800 MG tablet 1 tablet Orally every 8 hrs as needed for pain (take with food)       No current facility-administered medications for this visit.       No Known Allergies  Patient Active Problem List   Diagnosis    Plica syndrome of knee    Chronic fatigue    Pes anserinus tendinitis and bursitis    Stress    Elevated BP without diagnosis of hypertension    Elevated LFTs    Mixed hyperlipidemia    Daytime somnolence     Past Medical History:   Diagnosis Date    Alcohol abuse     Asthma     Chronic fatigue     Depression     Drug abuse (HCC)     Pes anserine bursitis 10/20/2011    ROBERT SCHODERBEK    Quadriceps tendinitis 10/20/2011    ROBERT SCHODERBEK    Stress 07/13/2018     Past Surgical History:   Procedure Laterality Date    APPENDECTOMY      CHOLECYSTECTOMY       Family History   Problem Relation Age of Onset    Diabetes Mother     Breast Cancer  Maternal Grandmother      Social History     Tobacco Use    Smoking status: Never    Smokeless tobacco: Never   Substance Use Topics    Alcohol use: Yes     Alcohol/week: 3.0 standard drinks     Types: 3 Drinks containing 0.5 oz of alcohol per week     Comment: 2-4 TIMES A MONTH     Social History     Social History Narrative    Not on file         ROS:    Review of Systems   Constitutional:  Negative for activity change, fatigue and fever.   Respiratory:  Negative for shortness of breath.    Cardiovascular:  Negative for chest pain.   Gastrointestinal:  Negative for abdominal pain and nausea.   Genitourinary:  Negative for difficulty urinating.   Neurological:  Negative for dizziness and headaches.   Hematological:  Negative for adenopathy.  PHYSICAL EXAM:    BP 120/72 (Site: Left Upper Arm, Position: Sitting, Cuff Size: Large Adult)   Pulse 81   Wt 216 lb 2 oz (98 kg)   SpO2 98%   BMI 30.14 kg/m   Vitals:    02/02/22 1404   BP: 120/72   Site: Left Upper Arm   Position: Sitting   Cuff Size: Large Adult   Pulse: 81   SpO2: 98%   Weight: 216 lb 2 oz (98 kg)         Physical Exam  Constitutional:       General: He is not in acute distress.     Appearance: Normal appearance.   HENT:      Head: Normocephalic.   Cardiovascular:      Rate and Rhythm: Normal rate and regular rhythm.      Heart sounds: No murmur heard.  Pulmonary:      Effort: Pulmonary effort is normal. No respiratory distress.      Breath sounds: No rhonchi.   Chest:      Chest wall: No tenderness.   Abdominal:      General: There is no distension.      Palpations: Abdomen is soft. There is no mass.      Tenderness: There is no abdominal tenderness.      Hernia: No hernia is present.   Skin:     General: Skin is warm.      Findings: No rash.   Neurological:      General: No focal deficit present.      Mental Status: He is alert and oriented to person, place, and time.   Psychiatric:         Mood and Affect: Mood normal.       No results found  for any visits on 02/02/22.    ASSESSMENT and PLAN:    Mixed hyperlipidemia  Stress  -     DULoxetine (CYMBALTA) 60 MG extended release capsule; Take 1 capsule by mouth daily, Disp-90 capsule, R-1Normal  -     DULoxetine (CYMBALTA) 30 MG extended release capsule; Take 1 capsule by mouth daily, Disp-30 capsule, R-3Normal  Daytime somnolence  Elevated LFTs      Return in about 4 weeks (around 03/02/2022) for f/u stress labs prior.

## 2022-02-19 ENCOUNTER — Encounter

## 2022-03-02 ENCOUNTER — Ambulatory Visit
Admit: 2022-03-02 | Discharge: 2022-03-02 | Payer: BLUE CROSS/BLUE SHIELD | Attending: Family Medicine | Primary: Family Medicine

## 2022-03-02 DIAGNOSIS — F439 Reaction to severe stress, unspecified: Secondary | ICD-10-CM

## 2022-03-02 MED ORDER — BUPROPION HCL ER (XL) 150 MG PO TB24
150 MG | ORAL_TABLET | Freq: Every morning | ORAL | 3 refills | Status: AC
Start: 2022-03-02 — End: 2022-04-14

## 2022-03-02 MED ORDER — CITALOPRAM HYDROBROMIDE 10 MG PO TABS
10 MG | ORAL_TABLET | Freq: Every day | ORAL | 1 refills | Status: AC
Start: 2022-03-02 — End: 2022-05-01

## 2022-03-02 NOTE — Progress Notes (Signed)
Date of Service: 03/02/2022  Patient: Alan Myers  DOB: 07-09-1980  Chief Complaint   Patient presents with   . Follow-up       Patient Care Team:  Vilma Prader, MD as PCP - General  Vilma Prader, MD as PCP - Empaneled Provider    History of Present Illness:  Alan Myers a 41 y.o. male presents today for evaluation of the following issues:  Anxiety tress depression on meds which work well, has chronic fatigue , feels unmotivated , no sleep issues   09/08/21 here for  f/u tried B12 w/o any improvement , feels tired through out the day , pt snores   11/04/21 stress depression trouble getting motivated ,is set for sleep study consultation,   02/02/22 stress depression feels more moody medication not helping as much as it did   03/02/22 here for f/u increased dose of cymbalta made him feel even more moody, cannot concentrate at work has low energy  . Pt sleeps well at night , does not feel sad     BP better improved       Current Outpatient Medications   Medication Sig Dispense Refill   . citalopram (CELEXA) 10 MG tablet Take 1 tablet by mouth daily 30 tablet 1   . buPROPion (WELLBUTRIN XL) 150 MG extended release tablet Take 1 tablet by mouth every morning 30 tablet 3   . ibuprofen (ADVIL;MOTRIN) 800 MG tablet 1 tablet Orally every 8 hrs as needed for pain (take with food)       No current facility-administered medications for this visit.       No Known Allergies  Patient Active Problem List   Diagnosis   . Plica syndrome of knee   . Chronic fatigue   . Pes anserinus tendinitis and bursitis   . Stress   . Elevated BP without diagnosis of hypertension   . Elevated LFTs   . Mixed hyperlipidemia   . Daytime somnolence     Past Medical History:   Diagnosis Date   . Alcohol abuse    . Asthma    . Chronic fatigue    . Depression    . Drug abuse (HCC)    . Pes anserine bursitis 10/20/2011    Alan Myers   . Quadriceps tendinitis 10/20/2011    Alan Myers   . Stress 07/13/2018     Past Surgical History:   Procedure  Laterality Date   . APPENDECTOMY     . CHOLECYSTECTOMY       Family History   Problem Relation Age of Onset   . Diabetes Mother    . Breast Cancer Maternal Grandmother      Social History     Tobacco Use   . Smoking status: Never   . Smokeless tobacco: Never   Substance Use Topics   . Alcohol use: Yes     Alcohol/week: 3.0 standard drinks of alcohol     Types: 3 Drinks containing 0.5 oz of alcohol per week     Comment: 2-4 TIMES A MONTH     Social History     Social History Narrative   . Not on file         ROS:    Review of Systems   Constitutional:  Negative for activity change, fatigue and fever.   Respiratory:  Negative for shortness of breath.    Cardiovascular:  Negative for chest pain.   Gastrointestinal:  Negative for abdominal pain and nausea.  Genitourinary:  Negative for difficulty urinating.   Neurological:  Negative for dizziness and headaches.   Hematological:  Negative for adenopathy.         PHYSICAL EXAM:    BP 138/86   Pulse 85   Wt 218 lb 1 oz (98.9 kg)   SpO2 97%   BMI 30.41 kg/m   Vitals:    03/02/22 1410 03/02/22 1419   BP: (!) 142/82 138/86   Site: Left Upper Arm    Position: Sitting    Cuff Size: Large Adult    Pulse: 85    SpO2: 97%    Weight: 218 lb 1 oz (98.9 kg)          Physical Exam  Constitutional:       General: He is not in acute distress.     Appearance: Normal appearance.   HENT:      Head: Normocephalic.   Cardiovascular:      Rate and Rhythm: Normal rate and regular rhythm.      Heart sounds: No murmur heard.  Pulmonary:      Effort: Pulmonary effort is normal. No respiratory distress.      Breath sounds: No rhonchi.   Chest:      Chest wall: No tenderness.   Abdominal:      General: There is no distension.      Palpations: Abdomen is soft. There is no mass.      Tenderness: There is no abdominal tenderness.      Hernia: No hernia is present.   Skin:     General: Skin is warm.      Findings: No rash.   Neurological:      General: No focal deficit present.      Mental  Status: He is alert and oriented to person, place, and time.   Psychiatric:         Mood and Affect: Mood normal.       No results found for any visits on 03/02/22.    ASSESSMENT and PLAN:    Stress  -     citalopram (CELEXA) 10 MG tablet; Take 1 tablet by mouth daily, Disp-30 tablet, R-1Normal  -     buPROPion (WELLBUTRIN XL) 150 MG extended release tablet; Take 1 tablet by mouth every morning, Disp-30 tablet, R-3Normal  Dysthymic disorder  -     citalopram (CELEXA) 10 MG tablet; Take 1 tablet by mouth daily, Disp-30 tablet, R-1Normal  -     buPROPion (WELLBUTRIN XL) 150 MG extended release tablet; Take 1 tablet by mouth every morning, Disp-30 tablet, R-3Normal      Return in about 3 weeks (around 03/23/2022).

## 2022-03-24 ENCOUNTER — Ambulatory Visit
Admit: 2022-03-24 | Discharge: 2022-03-24 | Payer: BLUE CROSS/BLUE SHIELD | Attending: Family Medicine | Primary: Family Medicine

## 2022-03-24 DIAGNOSIS — F341 Dysthymic disorder: Secondary | ICD-10-CM

## 2022-03-24 MED ORDER — CARIPRAZINE HCL 1.5 MG PO CAPS
1.5 MG | ORAL_CAPSULE | Freq: Every day | ORAL | 1 refills | Status: DC
Start: 2022-03-24 — End: 2022-04-14

## 2022-03-24 NOTE — Progress Notes (Signed)
Date of Service: 03/24/2022  Patient: Alan Myers  DOB: Jan 23, 1981  Chief Complaint   Patient presents with    Follow-up     Pt states medications Cymbalta and Wellbutrin isn't working well        Patient Care Team:  Vilma Prader, MD as PCP - General  Vilma Prader, MD as PCP - Empaneled Provider    History of Present Illness:  Alan Myers a 41 y.o. male presents today for evaluation of the following issues:  Anxiety tress depression on meds which work well, has chronic fatigue , feels unmotivated , no sleep issues   09/08/21 here for  f/u tried B12 w/o any improvement , feels tired through out the day , pt snores   11/04/21 stress depression trouble getting motivated ,is set for sleep study consultation,   02/02/22 stress depression feels more moody medication not helping as much as it did   03/02/22 here for f/u increased dose of cymbalta made him feel even more moody, cannot concentrate at work has low energy  . Pt sleeps well at night , does not feel sad   03/24/22 combo of celexa/wellburtin did not work, he felt blah and angry , his moods change quickly       Current Outpatient Medications   Medication Sig Dispense Refill    cariprazine hcl (VRAYLAR) 1.5 MG capsule Take 1 capsule by mouth daily 30 capsule 1    citalopram (CELEXA) 10 MG tablet Take 1 tablet by mouth daily 30 tablet 1    buPROPion (WELLBUTRIN XL) 150 MG extended release tablet Take 1 tablet by mouth every morning 30 tablet 3    ibuprofen (ADVIL;MOTRIN) 800 MG tablet 1 tablet Orally every 8 hrs as needed for pain (take with food)       No current facility-administered medications for this visit.       No Known Allergies  Patient Active Problem List   Diagnosis    Plica syndrome of knee    Chronic fatigue    Pes anserinus tendinitis and bursitis    Stress    Elevated BP without diagnosis of hypertension    Elevated LFTs    Mixed hyperlipidemia    Daytime somnolence     Past Medical History:   Diagnosis Date    Alcohol abuse     Asthma     Chronic  fatigue     Depression     Drug abuse (HCC)     Pes anserine bursitis 10/20/2011    Alan Myers    Quadriceps tendinitis 10/20/2011    Alan Myers    Stress 07/13/2018     Past Surgical History:   Procedure Laterality Date    APPENDECTOMY      CHOLECYSTECTOMY       Family History   Problem Relation Age of Onset    Diabetes Mother     Breast Cancer Maternal Grandmother      Social History     Tobacco Use    Smoking status: Never    Smokeless tobacco: Never   Substance Use Topics    Alcohol use: Yes     Alcohol/week: 3.0 standard drinks of alcohol     Types: 3 Drinks containing 0.5 oz of alcohol per week     Comment: 2-4 TIMES A MONTH     Social History     Social History Narrative    Not on file         ROS:  Review of Systems   Constitutional:  Negative for activity change, fatigue and fever.   Respiratory:  Negative for shortness of breath.    Cardiovascular:  Negative for chest pain.   Gastrointestinal:  Negative for abdominal pain and nausea.   Genitourinary:  Negative for difficulty urinating.   Neurological:  Negative for dizziness and headaches.   Hematological:  Negative for adenopathy.         PHYSICAL EXAM:    BP 118/76   Pulse 74   Ht 5\' 11"  (1.803 m)   Wt 221 lb 6.4 oz (100.4 kg)   SpO2 98%   BMI 30.88 kg/m   Vitals:    03/24/22 1412   BP: 118/76   Pulse: 74   SpO2: 98%   Weight: 221 lb 6.4 oz (100.4 kg)   Height: 5\' 11"  (1.803 m)         Physical Exam  Constitutional:       General: He is not in acute distress.     Appearance: Normal appearance.   HENT:      Head: Normocephalic.   Cardiovascular:      Rate and Rhythm: Normal rate and regular rhythm.      Heart sounds: No murmur heard.  Pulmonary:      Effort: Pulmonary effort is normal. No respiratory distress.      Breath sounds: No rhonchi.   Chest:      Chest wall: No tenderness.   Abdominal:      General: There is no distension.      Palpations: Abdomen is soft. There is no mass.      Tenderness: There is no abdominal tenderness.       Hernia: No hernia is present.   Skin:     General: Skin is warm.      Findings: No rash.   Neurological:      General: No focal deficit present.      Mental Status: He is alert and oriented to person, place, and time.   Psychiatric:         Mood and Affect: Mood normal.         No results found for any visits on 03/24/22.    ASSESSMENT and PLAN:    Dysthymic disorder  -     cariprazine hcl (VRAYLAR) 1.5 MG capsule; Take 1 capsule by mouth daily, Disp-30 capsule, R-1Normal      Return in about 3 weeks (around 04/14/2022).

## 2022-04-14 ENCOUNTER — Ambulatory Visit
Admit: 2022-04-14 | Discharge: 2022-04-14 | Payer: BLUE CROSS/BLUE SHIELD | Attending: Family Medicine | Primary: Family Medicine

## 2022-04-14 DIAGNOSIS — R635 Abnormal weight gain: Secondary | ICD-10-CM

## 2022-04-14 MED ORDER — PHENTERMINE HCL 37.5 MG PO TABS
37.5 MG | ORAL_TABLET | Freq: Every day | ORAL | 2 refills | Status: AC
Start: 2022-04-14 — End: 2022-07-13

## 2022-04-14 MED ORDER — CARIPRAZINE HCL 1.5 MG PO CAPS
1.5 MG | ORAL_CAPSULE | Freq: Every day | ORAL | 2 refills | Status: AC
Start: 2022-04-14 — End: ?

## 2022-04-14 NOTE — Progress Notes (Signed)
Date of Service: 04/14/2022  Patient: Alan Myers  DOB: Dec 18, 1980  Chief Complaint   Patient presents with    Follow-up       Patient Care Team:  Vilma Prader, MD as PCP - General  Vilma Prader, MD as PCP - Empaneled Provider    History of Present Illness:  Alan Myers a 41 y.o. male presents today for evaluation of the following issues:  Anxiety tress depression on meds which work well, has chronic fatigue , feels unmotivated , no sleep issues   09/08/21 here for  f/u tried B12 w/o any improvement , feels tired through out the day , pt snores   11/04/21 stress depression trouble getting motivated ,is set for sleep study consultation,   02/02/22 stress depression feels more moody medication not helping as much as it did   03/02/22 here for f/u increased dose of cymbalta made him feel even more moody, cannot concentrate at work has low energy  . Pt sleeps well at night , does not feel sad   03/24/22 combo of celexa/wellburtin did not work, he felt blah and angry , his moods change quickly   04/14/22 Vraylar work well , not moody, not anxious , rx makes him sleepy and he feels hungry all the time     Current Outpatient Medications   Medication Sig Dispense Refill    cariprazine hcl (VRAYLAR) 1.5 MG capsule Take 1 capsule by mouth daily 30 capsule 2    phentermine (ADIPEX-P) 37.5 MG tablet Take 1 tablet by mouth every morning (before breakfast) for 90 days. Max Daily Amount: 37.5 mg 30 tablet 2    ibuprofen (ADVIL;MOTRIN) 800 MG tablet 1 tablet Orally every 8 hrs as needed for pain (take with food)       No current facility-administered medications for this visit.       No Known Allergies  Patient Active Problem List   Diagnosis    Plica syndrome of knee    Chronic fatigue    Pes anserinus tendinitis and bursitis    Stress    Elevated BP without diagnosis of hypertension    Elevated LFTs    Mixed hyperlipidemia    Daytime somnolence     Past Medical History:   Diagnosis Date    Alcohol abuse     Asthma     Chronic  fatigue     Depression     Drug abuse (HCC)     Pes anserine bursitis 10/20/2011    ROBERT SCHODERBEK    Quadriceps tendinitis 10/20/2011    ROBERT SCHODERBEK    Stress 07/13/2018     Past Surgical History:   Procedure Laterality Date    APPENDECTOMY      CHOLECYSTECTOMY       Family History   Problem Relation Age of Onset    Diabetes Mother     Breast Cancer Maternal Grandmother      Social History     Tobacco Use    Smoking status: Never    Smokeless tobacco: Never   Substance Use Topics    Alcohol use: Yes     Alcohol/week: 3.0 standard drinks of alcohol     Types: 3 Drinks containing 0.5 oz of alcohol per week     Comment: 2-4 TIMES A MONTH     Social History     Social History Narrative    Not on file         ROS:    Review of  Systems   Constitutional:  Negative for activity change, fatigue and fever.   Respiratory:  Negative for shortness of breath.    Cardiovascular:  Negative for chest pain.   Gastrointestinal:  Negative for abdominal pain and nausea.   Genitourinary:  Negative for difficulty urinating.   Neurological:  Negative for dizziness and headaches.   Hematological:  Negative for adenopathy.         PHYSICAL EXAM:    BP 122/84 (Site: Left Upper Arm, Position: Sitting, Cuff Size: Large Adult)   Pulse 83   Temp 97.9 F (36.6 C)   Wt 102.6 kg (226 lb 3.2 oz)   SpO2 98%   BMI 31.55 kg/m   Vitals:    04/14/22 1447   BP: 122/84   Site: Left Upper Arm   Position: Sitting   Cuff Size: Large Adult   Pulse: 83   Temp: 97.9 F (36.6 C)   SpO2: 98%   Weight: 102.6 kg (226 lb 3.2 oz)         Physical Exam  Constitutional:       General: He is not in acute distress.     Appearance: Normal appearance.   HENT:      Head: Normocephalic.   Cardiovascular:      Rate and Rhythm: Normal rate and regular rhythm.      Heart sounds: No murmur heard.  Pulmonary:      Effort: Pulmonary effort is normal. No respiratory distress.      Breath sounds: No rhonchi.   Chest:      Chest wall: No tenderness.   Abdominal:       General: There is no distension.      Palpations: Abdomen is soft. There is no mass.      Tenderness: There is no abdominal tenderness.      Hernia: No hernia is present.   Skin:     General: Skin is warm.      Findings: No rash.   Neurological:      General: No focal deficit present.      Mental Status: He is alert and oriented to person, place, and time.   Psychiatric:         Mood and Affect: Mood normal.         No results found for any visits on 04/14/22.    ASSESSMENT and PLAN:    Weight gain  -     phentermine (ADIPEX-P) 37.5 MG tablet; Take 1 tablet by mouth every morning (before breakfast) for 90 days. Max Daily Amount: 37.5 mg, Disp-30 tablet, R-2Normal  Dysthymic disorder  -     cariprazine hcl (VRAYLAR) 1.5 MG capsule; Take 1 capsule by mouth daily, Disp-30 capsule, R-2Normal      No follow-ups on file.
